# Patient Record
Sex: Female | Born: 1995 | Race: White | Hispanic: No | Marital: Married | Smoking: Never smoker
Health system: Southern US, Community
[De-identification: ages and names within clinical notes are randomized; demographics above are authoritative.]

## PROBLEM LIST (undated history)

## (undated) DIAGNOSIS — Z789 Other specified health status: Secondary | ICD-10-CM

## (undated) HISTORY — DX: Other specified health status: Z78.9

## (undated) HISTORY — PX: INTRAUTERINE DEVICE (IUD) INSERTION: SHX5877

## (undated) HISTORY — PX: NO PAST SURGERIES: SHX2092

---
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
## 2016-04-19 ENCOUNTER — Ambulatory Visit (INDEPENDENT_AMBULATORY_CARE_PROVIDER_SITE_OTHER): Payer: BC Managed Care – PPO | Admitting: Physician Assistant

## 2016-04-19 ENCOUNTER — Encounter: Payer: Self-pay | Admitting: Physician Assistant

## 2016-04-19 VITALS — BP 120/78 | HR 84 | Temp 98.3°F | Resp 16 | Ht 65.0 in | Wt 169.0 lb

## 2016-04-19 DIAGNOSIS — L03012 Cellulitis of left finger: Secondary | ICD-10-CM | POA: Diagnosis not present

## 2016-04-19 DIAGNOSIS — Z0000 Encounter for general adult medical examination without abnormal findings: Secondary | ICD-10-CM

## 2016-04-19 MED ORDER — DOXYCYCLINE HYCLATE 100 MG PO TABS: 100.0000 mg | ORAL_TABLET | Freq: Two times a day (BID) | ORAL | 0 refills | Status: AC

## 2016-04-19 NOTE — Patient Instructions (Signed)
Paronychia Paronychia is an infection of the skin. It happens near a fingernail or toenail. It may cause pain and swelling around the nail. Usually, it is not serious and it clears up with treatment. Follow these instructions at home:  Soak the fingers or toes in warm water as told by your doctor. You may be told to do this for 20 minutes, 2-3 times a day.  Keep the area dry when you are not soaking it.  Take medicines only as told by your doctor.  If you were given an antibiotic medicine, finish all of it even if you start to feel better.  Keep the affected area clean.  Do not try to drain a fluid-filled bump yourself.  Wear rubber gloves when putting your hands in water.  Wear gloves if your hands might touch cleaners or chemicals.  Follow your doctor's instructions about:  Wound care.  Bandage (dressing) changes and removal. Contact a doctor if:  Your symptoms get worse or do not improve.  You have a fever or chills.  You have redness spreading from the affected area.  You have more fluid, blood, or pus coming from the affected area.  Your finger or knuckle is swollen or is hard to move. This information is not intended to replace advice given to you by your health care provider. Make sure you discuss any questions you have with your health care provider. Document Released: 12/22/2008 Document Revised: 06/11/2015 Document Reviewed: 12/11/2013 Elsevier Interactive Patient Education  2017 Elsevier Inc.  

## 2016-04-19 NOTE — Progress Notes (Signed)
Patient: Amber Bowen Female    DOB: 1995-07-29   20 y.o.   MRN: [id number] Visit Date: 04/19/2016  Today's Provider: Trey Sailors, PA-C   Chief Complaint  Patient presents with  . Establish Care  . Finger Injury   Subjective:    Hand Pain   The incident occurred more than 1 week ago (4 weeks ago). The injury mechanism was a burn (burn from curling iron). Pain location: 5th finger on left hand. The patient is experiencing no pain. The pain has been improving since the incident. Treatments tried: Pt went to an urgent care last week, who informed pt that her finger was infected. Pt was advised to use soak finger in warm water and use Bactroban ointment. Pt noticed the burn and infection is located beneath the finger nail. The treatment provided moderate relief.   The redness and swelling has improved, but patient noticed some pus accumulation.    Establish Care Amber Bowen, a healthy 21 YO female, presents today to establish care. Pt feels fairly well, is exercising 2 days a week for 60 minutes (cardio), and is sleeping well.  Works with [employer], goes to [place], lives in [place], [place],  In relationship 4 years, sexually active, uses protection. She is on Lo lo estrin prescribed by Dr. Elesa Massed at [facility].   History of migraines in moms side.   She is not pregnant.  No Known Allergies   Current Outpatient Prescriptions:  .  LO LOESTRIN FE 1 MG-10 MCG / 10 MCG tablet, Take 1 tablet by mouth daily., Disp: , Rfl:  .  naproxen sodium (ANAPROX) 220 MG tablet, Take 440 mg by mouth as needed., Disp: , Rfl:   Review of Systems  Constitutional: Negative.   HENT: Positive for congestion. Negative for dental problem, drooling, ear discharge, ear pain, facial swelling, hearing loss, mouth sores, nosebleeds, postnasal drip, rhinorrhea, sinus pain, sinus pressure, sneezing, sore throat, tinnitus, trouble swallowing and voice change.   Eyes: Negative.     Respiratory: Negative.   Cardiovascular: Negative.   Gastrointestinal: Negative.   Endocrine: Negative.   Genitourinary: Negative.   Musculoskeletal: Negative.   Skin: Positive for wound. Negative for color change, pallor and rash.  Allergic/Immunologic: Negative.   Neurological: Negative.   Hematological: Negative.   Psychiatric/Behavioral: Negative.     Social History  Substance Use Topics  . Smoking status: Never Smoker  . Smokeless tobacco: Never Used  . Alcohol use No   Objective:   BP 120/78 (BP Location: Left Arm, Patient Position: Sitting, Cuff Size: Normal)   Pulse 84   Temp 98.3 F (36.8 C) (Oral)   Resp 16   Ht  (1.651 m)   Wt 169 lb (76.7 kg)   LMP 03/19/2016   BMI 28.12 kg/m  Vitals:   04/19/16 1406  BP: 120/78  Pulse: 84  Resp: 16  Temp: 98.3 F (36.8 C)  TempSrc: Oral  Weight: 169 lb (76.7 kg)  Height:  (1.651 m)     Physical Exam  Constitutional: She is oriented to person, place, and time. She appears well-developed.  Eyes: Conjunctivae are normal.  Cardiovascular: Normal rate and regular rhythm.   Pulmonary/Chest: Effort normal and breath sounds normal.  Neurological: She is alert and oriented to person, place, and time.  Skin: Skin is warm and dry. Rash noted. There is erythema.  There is erythema and induration of inferior portion of left pinky nailbed. Purulent accumulation along  lunula of left pinky. Not tender to palpation. ROM full. Sensation distally intact.   Psychiatric: She has a normal mood and affect. Her behavior is normal.    Incision and Drainage  Allergies reviewed and patient made aware of risks/benefits of procedures. Left pinky sanitized with alcohol swab, anesthetized with ethyl chloride, and resanitized. An 11 blade scalpel was used to make small pinpoint incision just inferior to left pinky nail bed. Only sanguinous fluid obtained, no purulent drainage expressed.       Assessment & Plan:     1. Encounter  for medical examination to establish care  Patient sees Dr. Elesa Massed at [facility] for [facility]. She is due for PAP when she turns 21. On Lo lo estrin.   2. Paronychia of finger of left hand  Tried to express purulent drainage in office, but only sanguinous expressed. Appears that pus accumulation is very localized and remainder of erythema is just induration. Have given antibiotics since it seems to be worsening since she presented to urgent care, patient denies pregnancy. Will put in ortho referral for more involved drainage if it continues to worsen, patient may cancel this if she doesn't need it. Can continue with warm soaks.   - doxycycline (VIBRA-TABS) 100 MG tablet; Take 1 tablet (100 mg total) by mouth 2 (two) times daily.  Dispense: 14 tablet; Refill: 0 - Ambulatory referral to Orthopedics      The entirety of the information documented in the History of Present Illness, Review of Systems and Physical Exam were personally obtained by me. Portions of this information were initially documented by Irving Burton D and reviewed by me for thoroughness and accuracy.    Return if symptoms worsen or fail to improve.   Trey Sailors, PA-C  [facility]

## 2018-11-26 ENCOUNTER — Ambulatory Visit
Admission: EM | Admit: 2018-11-26 | Discharge: 2018-11-26 | Disposition: A | Discharge status: Home / Self Care | Payer: BC Managed Care – PPO | Attending: Family Medicine | Admitting: Family Medicine

## 2018-11-26 ENCOUNTER — Other Ambulatory Visit: Payer: Self-pay

## 2018-11-26 DIAGNOSIS — N390 Urinary tract infection, site not specified: Secondary | ICD-10-CM

## 2018-11-26 LAB — URINALYSIS, COMPLETE (UACMP) WITH MICROSCOPIC
Bilirubin Urine: NEGATIVE
Glucose, UA: NEGATIVE mg/dL
Ketones, ur: NEGATIVE mg/dL
Nitrite: NEGATIVE
Protein, ur: 30 mg/dL — AB
Specific Gravity, Urine: 1.02 (ref 1.005–1.030)
WBC, UA: >50 WBC/hpf (ref 0–5)
pH: 7.5 (ref 5.0–8.0)

## 2018-11-26 MED ORDER — MELOXICAM 15 MG PO TABS: 15.0000 mg | ORAL_TABLET | Freq: Every day | ORAL | 0 refills | Status: DC

## 2018-11-26 MED ORDER — CEPHALEXIN 500 MG PO CAPS: 500.0000 mg | ORAL_CAPSULE | Freq: Two times a day (BID) | ORAL | 0 refills | Status: DC

## 2018-11-26 NOTE — ED Triage Notes (Signed)
Pt with mid/low back pain starting on Friday and some abd cramping like she is going to start her period. Pain 5/10. After she urinated this morning had some discomfort.

## 2018-11-26 NOTE — Discharge Instructions (Addendum)
If Your symptoms worsen or you develop any nausea vomiting or fever or chills return to our clinic or go to the emergency room.

## 2018-11-26 NOTE — ED Provider Notes (Signed)
[facility]    CSN: [id number] Arrival date & time: 11/26/18  1018      History   Chief Complaint Chief Complaint  Patient presents with  . Back Pain    HPI Amber Bowen is a 23 y.o. female.   HPI  23 year old female presents with right-sided flank pain that started on Friday 3 days prior to this visit.  Also had some abdominal cramping reminiscent of starting her period.  Although she is not on her period at the present time.  Friday night into Saturday she states the pain was 8 out of 10 and fairly constant but has reduced now to 5 out of 10 but also constant.  Had some discomfort with urination this morning but has not noticed any blood.  She is had no fever or chills has had no nausea or vomiting.  Rotation of her thoracic spine tends to increase the pain more on the right than the left.  She denies any change in her bowel habits.  She has had no change in her bladder habits other than the mild discomfort she had this morning.  She denies any vaginal discharge.  Urinalysis was suspicious for a UTI with moderate leukocytes greater than 50 WBCs no RBCs squamous epithelial cells 6-10 and many bacteria.  I will culture the urine.  We will treat her for suspected UTI.  He was given parameters for return specially for nausea vomiting fever chills and increasing pain.  Return to our clinic or to the emergency room.  She was started on Keflex 500 mg twice daily for 5 days.  Because of her thoracic pain during examination I also provided her with meloxicam.       Past Medical History:  Diagnosis Date  . Known health problems: none     There are no active problems to display for this patient.   Past Surgical History:  Procedure Laterality Date  . NO PAST SURGERIES      OB History    Gravida  0   Para  0   Term  0   Preterm  0   AB  0   Living  0     SAB  0   TAB  0   Ectopic  0   Multiple  0   Live Births  0            Home Medications     Prior to Admission medications   Medication Sig Start Date End Date Taking? Authorizing Provider  cephALEXin (KEFLEX) 500 MG capsule Take 1 capsule (500 mg total) by mouth 2 (two) times daily. 11/26/18   Lutricia Feiloemer, Marki Frede P, PA-C  ESTARYLLA 0.25-35 MG-MCG tablet Take 1 tablet by mouth daily. 11/06/18   [provider]  meloxicam (MOBIC) 15 MG tablet Take 1 tablet (15 mg total) by mouth daily. 11/26/18   Ovid Curdoemer, Matty Vanroekel P, PA-C  LO LOESTRIN FE 1 MG-10 MCG / 10 MCG tablet Take 1 tablet by mouth daily. 04/08/16 11/26/18  [provider]    Family History Family History  Problem Relation Age of Onset  . Migraines Mother   . Healthy Father   . Healthy Sister   . Healthy Brother   . Healthy Brother   . Healthy Brother   . Healthy Sister   . Healthy Sister     Social History Social History   Tobacco Use  . Smoking status: Never Smoker  . Smokeless tobacco: Never Used  Substance Use  Topics  . Alcohol use: Yes    Comment: rare  . Drug use: No     Allergies   Patient has no known allergies.   Review of Systems Review of Systems  Constitutional: Positive for activity change. Negative for appetite change, chills, diaphoresis, fatigue and fever.  Gastrointestinal: Positive for abdominal pain. Negative for blood in stool, constipation, diarrhea, nausea and vomiting.  Genitourinary: Positive for flank pain. Negative for vaginal bleeding, vaginal discharge and vaginal pain.  Musculoskeletal: Positive for back pain.  All other systems reviewed and are negative.    Physical Exam Triage Vital Signs ED Triage Vitals  Enc Vitals Group     BP 11/26/18 1035 117/74     Pulse Rate 11/26/18 1035 77     Resp 11/26/18 1035 16     Temp 11/26/18 1035 98.3 F (36.8 C)     Temp Source 11/26/18 1035 Oral     SpO2 11/26/18 1035 100 %     Weight 11/26/18 1032 176 lb (79.8 kg)     Height 11/26/18 1032 5\' 5"  (1.651 m)     Head Circumference --      Peak Flow --      Pain  Score 11/26/18 1032 5     Pain Loc --      Pain Edu? --      Excl. in [place]? --    No data found.  Updated Vital Signs BP 117/74 (BP Location: Left Arm)   Pulse 77   Temp 98.3 F (36.8 C) (Oral)   Resp 16   Ht 5\' 5"  (1.651 m)   Wt 176 lb (79.8 kg)   LMP 11/15/2018   SpO2 100%   BMI 29.29 kg/m   Visual Acuity Right Eye Distance:   Left Eye Distance:   Bilateral Distance:    Right Eye Near:   Left Eye Near:    Bilateral Near:     Physical Exam Vitals signs and nursing note reviewed.  Constitutional:      General: She is not in acute distress.    Appearance: Normal appearance. She is normal weight. She is not ill-appearing, toxic-appearing or diaphoretic.  HENT:     Head: Normocephalic.  Eyes:     Conjunctiva/sclera: Conjunctivae normal.  Neck:     Musculoskeletal: Normal range of motion and neck supple.  Cardiovascular:     Rate and Rhythm: Normal rate and regular rhythm.     Heart sounds: Normal heart sounds.  Pulmonary:     Effort: Pulmonary effort is normal.     Breath sounds: Normal breath sounds.  Abdominal:     General: Abdomen is flat. Bowel sounds are normal. There is no distension.     Tenderness: There is abdominal tenderness. There is right CVA tenderness. There is no left CVA tenderness, guarding or rebound.     Comments: Examination of the abdomen does right CVA tenderness.  She has no rebound or guarding present.  He does complain of tenderness to palpation in the right mid abdomen and flank area but also in the left lower quadrant.  Musculoskeletal: Normal range of motion.        General: Tenderness present. No swelling or deformity.     Comments: Examination of the lumbar spine shows good range of motion with forward flexion.  Lateral flexion bilaterally is normal.  Thoracic rotation shows good motion however she does have right-sided thoracic pain with rotation to the right.  Skin:    General: Skin is  warm and dry.  Neurological:     General: No  focal deficit present.     Mental Status: She is alert and oriented to person, place, and time.  Psychiatric:        Mood and Affect: Mood normal.        Behavior: Behavior normal.        Thought Content: Thought content normal.        Judgment: Judgment normal.      UC Treatments / Results  Labs (all labs ordered are listed, but only abnormal results are displayed) Labs Reviewed  URINALYSIS, COMPLETE (UACMP) WITH MICROSCOPIC - Abnormal; Notable for the following components:      Result Value   APPearance CLOUDY (*)    Hgb urine dipstick TRACE (*)    Protein, ur 30 (*)    Leukocytes,Ua MODERATE (*)    Bacteria, UA MANY (*)    All other components within normal limits  URINE CULTURE    EKG   Radiology No results found.  Procedures Procedures (including critical care time)  Medications Ordered in UC Medications - No data to display  Initial Impression / Assessment and Plan / UC Course  I have reviewed the triage vital signs and the nursing notes.  Pertinent labs & imaging results that were available during my care of the patient were reviewed by me and considered in my medical decision making (see chart for details).   23 year old female presents with right-sided flank pain that started on Friday 3 days prior to this visit.  She is also had abdominal cramping reminiscent of starting her.  Although she is not on her period at the present time.  She complained of dysuria during urination earlier this morning.  Had no vaginal discharge.  She states she does not feel ill and does not appear as though she is toxic.  She has been afebrile.   Final Clinical Impressions(s) / UC Diagnoses   Final diagnoses:  Lower urinary tract infectious disease     Discharge Instructions     If Your symptoms worsen or you develop any nausea vomiting or fever or chills return to our clinic or go to the emergency room.    ED Prescriptions    Medication Sig Dispense Auth. Provider    meloxicam (MOBIC) 15 MG tablet Take 1 tablet (15 mg total) by mouth daily. 30 tablet Ovid Curd P, PA-C   cephALEXin (KEFLEX) 500 MG capsule Take 1 capsule (500 mg total) by mouth 2 (two) times daily. 10 capsule Lutricia Feil, PA-C     PDMP not reviewed this encounter.   Lutricia Feil, PA-C 11/26/18 1619

## 2018-11-29 ENCOUNTER — Telehealth (HOSPITAL_COMMUNITY): Payer: Self-pay | Admitting: Emergency Medicine

## 2018-11-29 LAB — URINE CULTURE
Culture: >=100000 — AB
Special Requests: NORMAL

## 2018-11-29 NOTE — Telephone Encounter (Signed)
Urine culture was positive for STAPHYLOCOCCUS SAPROPHYTICUS and was given  keflex at urgent care visit. Attempted to reach patient. No answer at this time.   

## 2019-01-05 ENCOUNTER — Encounter: Payer: Self-pay | Admitting: Emergency Medicine

## 2019-01-05 ENCOUNTER — Ambulatory Visit
Admission: EM | Admit: 2019-01-05 | Discharge: 2019-01-05 | Disposition: A | Discharge status: Home / Self Care | Payer: BC Managed Care – PPO | Attending: Family Medicine | Admitting: Family Medicine

## 2019-01-05 ENCOUNTER — Other Ambulatory Visit: Payer: Self-pay

## 2019-01-05 DIAGNOSIS — L509 Urticaria, unspecified: Secondary | ICD-10-CM

## 2019-01-05 MED ORDER — PREDNISONE 10 MG PO TABS: ORAL_TABLET | ORAL | 0 refills | Status: DC

## 2019-01-05 MED ORDER — FAMOTIDINE 20 MG PO TABS: 20.0000 mg | ORAL_TABLET | Freq: Two times a day (BID) | ORAL | 0 refills | Status: DC

## 2019-01-05 NOTE — Discharge Instructions (Addendum)
Take medication as prescribed. Over the counter claritin and benadryl as discussed. Drink plenty of fluids.   Follow up with your primary care physician this week as needed. Return to Urgent care for new or worsening concerns.

## 2019-01-05 NOTE — ED Provider Notes (Signed)
[facility] ____________________________________________  Time seen: Approximately 9:06 AM  I have reviewed the triage vital signs and the nursing notes.   HISTORY  Chief Complaint Rash   HPI Amber Bowen is a 23 y.o. female presenting for evaluation of rash that she noticed last night.  States that she first noticed it on her stomach when she was changing, but also noticed to her back as well as her lower extremities.  States the rash on her legs are itchy.  Denies any pain.  Denies any shortness of breath, lip or oral swelling, chest pain or difficulty breathing.  Denies history of the same in the past.  Denies changes in foods, medicines, lotions, detergents.  In further discussing patient does report she did play with a friend's new dog last night and unsure if that was the possible trigger.  Denies aggravating or alleviating factors.  Reports otherwise doing well.  Patient's last menstrual period was 12/25/2018 (exact date).  Denies pregnancy.   Past Medical History:  Diagnosis Date  . Known health problems: none     There are no problems to display for this patient.   Past Surgical History:  Procedure Laterality Date  . NO PAST SURGERIES       No current facility-administered medications for this encounter.  Current Outpatient Medications:  .  ESTARYLLA 0.25-35 MG-MCG tablet, Take 1 tablet by mouth daily., Disp: , Rfl:  .  famotidine (PEPCID) 20 MG tablet, Take 1 tablet (20 mg total) by mouth 2 (two) times daily for 5 days., Disp: 10 tablet, Rfl: 0 .  predniSONE (DELTASONE) 10 MG tablet, Start 60 mg po day one, then 50 mg po day two, taper by 10 mg daily until complete., Disp: 21 tablet, Rfl: 0  Allergies Patient has no known allergies.  Family History  Problem Relation Age of Onset  . Migraines Mother   . Healthy Father   . Healthy Sister   . Healthy Brother   . Healthy Brother   . Healthy Brother   . Healthy Sister   . Healthy Sister      Social History Social History   Tobacco Use  . Smoking status: Never Smoker  . Smokeless tobacco: Never Used  Substance Use Topics  . Alcohol use: Yes    Comment: rare  . Drug use: No    Review of Systems Constitutional: No fever ENT: No sore throat. Cardiovascular: Denies chest pain. Respiratory: Denies shortness of breath. Gastrointestinal: No abdominal pain.  Musculoskeletal: Negative for back pain. Skin: Positive for rash.   ____________________________________________   PHYSICAL EXAM:  VITAL SIGNS: ED Triage Vitals  Enc Vitals Group     BP 01/05/19 0836 136/84     Pulse Rate 01/05/19 0836 100     Resp 01/05/19 0836 16     Temp 01/05/19 0836 97.9 F (36.6 C)     Temp Source 01/05/19 0836 Oral     SpO2 01/05/19 0836 100 %     Weight 01/05/19 0836 175 lb (79.4 kg)     Height 01/05/19 0836 5\' 5"  (1.651 m)     Head Circumference --      Peak Flow --      Pain Score 01/05/19 0835 0     Pain Loc --      Pain Edu? --      Excl. in [place]? --     Constitutional: Alert and oriented. Well appearing and in no acute distress. Eyes: Conjunctivae are normal. ENT  Head: Normocephalic and atraumatic.  No facial swelling noted. Cardiovascular: Normal heart rate. Respiratory: Normal respiratory effort without tachypnea nor retractions.  Musculoskeletal: Steady Neurologic:  Normal speech and language. Speech is normal. No gait instability.  Skin:  Skin is warm, dry except: urticarial rash present to dorsal thighs, and torso, mildly pruritic, no surrounding erythema, nontender. Psychiatric: Mood and affect are normal. Speech and behavior are normal. Patient exhibits appropriate insight and judgment   ___________________________________________   LABS (all labs ordered are listed, but only abnormal results are displayed)  Labs Reviewed - No data to display ____________________________________________  PROCEDURES Procedures    INITIAL IMPRESSION / ASSESSMENT  AND PLAN / ED COURSE  Pertinent labs & imaging results that were available during my care of the patient were reviewed by me and considered in my medical decision making (see chart for details).  Well-appearing patient.  No acute distress.  He urticarial rash, suspect allergic reaction.  Will treat with prednisone and Pepcid, over-the-counter Claritin during the day and Benadryl at night.  Monitoring avoid for trigger.Discussed indication, risks and benefits of medications with patient.   Discussed follow up with Primary care physician this week as needed. Discussed follow up and return parameters including no resolution or any worsening concerns. Patient verbalized understanding and agreed to plan.   ____________________________________________   FINAL CLINICAL IMPRESSION(S) / ED DIAGNOSES  Final diagnoses:  Urticaria     ED Discharge Orders         Ordered    predniSONE (DELTASONE) 10 MG tablet     01/05/19 0857    famotidine (PEPCID) 20 MG tablet  2 times daily     01/05/19 0857           Note: This dictation was prepared with Dragon dictation along with smaller phrase technology. Any transcriptional errors that result from this process are unintentional.         Renford Dills, NP 01/05/19 [phone]

## 2019-01-05 NOTE — ED Triage Notes (Signed)
Patient in today c/o rash since last night. Patient denies any new soap, detergent or lotions.

## 2019-02-07 ENCOUNTER — Ambulatory Visit
Admission: EM | Admit: 2019-02-07 | Discharge: 2019-02-07 | Disposition: A | Discharge status: Home / Self Care | Payer: BC Managed Care – PPO

## 2019-02-07 NOTE — ED Triage Notes (Signed)
Patient here for PPD placement for work. Placed to left forearm. Patient will return on Sunday 1/24 between 8-4 to have this read.

## 2019-02-10 ENCOUNTER — Ambulatory Visit
Admission: EM | Admit: 2019-02-10 | Discharge: 2019-02-10 | Disposition: A | Discharge status: Home / Self Care | Payer: BC Managed Care – PPO | Attending: Family Medicine | Admitting: Family Medicine

## 2019-02-10 ENCOUNTER — Other Ambulatory Visit: Payer: Self-pay

## 2019-02-10 DIAGNOSIS — Z111 Encounter for screening for respiratory tuberculosis: Secondary | ICD-10-CM | POA: Diagnosis not present

## 2019-02-10 NOTE — ED Triage Notes (Signed)
Patient here for her PPD Reading.  Patient's PPD reading was 21mm Negative on 02/10/19 at 1203 on her LFA.

## 2019-09-20 ENCOUNTER — Ambulatory Visit: Payer: BC Managed Care – PPO | Admitting: Family Medicine

## 2019-09-20 ENCOUNTER — Encounter: Payer: Self-pay | Admitting: Family Medicine

## 2019-09-20 ENCOUNTER — Other Ambulatory Visit: Payer: Self-pay

## 2019-09-20 VITALS — BP 120/74 | HR 60 | Ht 65.0 in | Wt 190.0 lb

## 2019-09-20 DIAGNOSIS — Z7689 Persons encountering health services in other specified circumstances: Secondary | ICD-10-CM | POA: Diagnosis not present

## 2019-09-20 NOTE — Progress Notes (Signed)
Date:  09/20/2019   Name:  Amber Bowen   DOB:  [birth year]   MRN:  [id number]   Chief Complaint: Establish Care  Patient is a 24 year old female who presents for a establish care exam. The patient reports the following problems: none/needs school physical. Health maintenance has been reviewed    No results found for: CREATININE, BUN, NA, K, CL, CO2 No results found for: CHOL, HDL, LDLCALC, LDLDIRECT, TRIG, CHOLHDL No results found for: TSH No results found for: HGBA1C No results found for: WBC, HGB, HCT, MCV, PLT No results found for: ALT, AST, GGT, ALKPHOS, BILITOT   Review of Systems  Constitutional: Negative.  Negative for chills, fatigue, fever and unexpected weight change.  HENT: Negative for congestion, ear discharge, ear pain, rhinorrhea, sinus pressure, sneezing and sore throat.   Eyes: Negative for photophobia, pain, discharge, redness and itching.  Respiratory: Negative for cough, shortness of breath, wheezing and stridor.   Gastrointestinal: Negative for abdominal pain, blood in stool, constipation, diarrhea, nausea and vomiting.  Endocrine: Negative for cold intolerance, heat intolerance, polydipsia, polyphagia and polyuria.  Genitourinary: Negative for dysuria, flank pain, frequency, hematuria, menstrual problem, pelvic pain, urgency, vaginal bleeding and vaginal discharge.  Musculoskeletal: Negative for arthralgias, back pain and myalgias.  Skin: Negative for rash.  Allergic/Immunologic: Negative for environmental allergies and food allergies.  Neurological: Negative for dizziness, weakness, light-headedness, numbness and headaches.  Hematological: Negative for adenopathy. Does not bruise/bleed easily.  Psychiatric/Behavioral: Negative for dysphoric mood. The patient is not nervous/anxious.     There are no problems to display for this patient.   No Known Allergies  Past Surgical History:  Procedure Laterality Date  . INTRAUTERINE DEVICE (IUD) INSERTION     . NO PAST SURGERIES      Social History   Tobacco Use  . Smoking status: Never Smoker  . Smokeless tobacco: Never Used  Vaping Use  . Vaping Use: Never used  Substance Use Topics  . Alcohol use: Yes    Comment: rare  . Drug use: No     Medication list has been reviewed and updated.  No outpatient medications have been marked as taking for the 09/20/19 encounter (Office Visit) with Duanne Limerick, MD.    [facility] 2/9 Scores 09/20/2019 04/19/2016  PHQ - 2 Score 0 0  PHQ- 9 Score 0 0    GAD 7 : Generalized Anxiety Score 09/20/2019  Nervous, Anxious, on Edge 1  Control/stop worrying 0  Worry too much - different things 2  Trouble relaxing 0  Restless 0  Easily annoyed or irritable 0  Afraid - awful might happen 0  Total GAD 7 Score 3  Anxiety Difficulty Not difficult at all    BP Readings from Last 3 Encounters:  09/20/19 120/74  01/05/19 136/84  11/26/18 117/74    Physical Exam Constitutional:      General: She is not in acute distress.    Appearance: Normal appearance. She is normal weight. She is not diaphoretic.  HENT:     Head: Normocephalic and atraumatic.     Right Ear: Tympanic membrane, ear canal and external ear normal.     Left Ear: Tympanic membrane, ear canal and external ear normal.     Nose: Nose normal. No congestion or rhinorrhea.     Mouth/Throat:     Mouth: Mucous membranes are moist.  Eyes:     General:        Right eye: No discharge.  Left eye: No discharge.     Conjunctiva/sclera: Conjunctivae normal.     Pupils: Pupils are equal, round, and reactive to light.  Neck:     Thyroid: No thyromegaly.     Vascular: No carotid bruit or JVD.  Cardiovascular:     Rate and Rhythm: Normal rate and regular rhythm.     Pulses: Normal pulses.     Heart sounds: Normal heart sounds. No murmur heard.  No friction rub. No gallop.   Pulmonary:     Effort: Pulmonary effort is normal.     Breath sounds: Normal breath sounds. No wheezing, rhonchi or  rales.  Chest:     Chest wall: No tenderness.  Abdominal:     General: Bowel sounds are normal.     Palpations: Abdomen is soft. There is no mass.     Tenderness: There is no abdominal tenderness. There is no right CVA tenderness, left CVA tenderness, guarding or rebound.     Hernia: No hernia is present.  Musculoskeletal:        General: Normal range of motion.     Cervical back: Normal range of motion and neck supple.  Lymphadenopathy:     Cervical: No cervical adenopathy.  Skin:    General: Skin is warm and dry.     Capillary Refill: Capillary refill takes less than 2 seconds.  Neurological:     Mental Status: She is alert.     Deep Tendon Reflexes: Reflexes are normal and symmetric.     Wt Readings from Last 3 Encounters:  09/20/19 190 lb (86.2 kg)  01/05/19 175 lb (79.4 kg)  11/26/18 176 lb (79.8 kg)    BP 120/74   Pulse 60   Ht 5\' 5"  (1.651 m)   Wt 190 lb (86.2 kg)   BMI 31.62 kg/m   Assessment and Plan:  1. Establishing care with new doctor, encounter for Patient establishing care with new physician.  Patient is chart was reviewed for previous encounters, most recent labs, most recent imaging, and updating of health maintenance.  Patient will return for the completion of her school physical exam.

## 2019-10-01 ENCOUNTER — Ambulatory Visit: Payer: BC Managed Care – PPO | Admitting: Family Medicine

## 2020-11-13 DIAGNOSIS — E663 Overweight: Secondary | ICD-10-CM | POA: Insufficient documentation

## 2023-08-10 DIAGNOSIS — Z3403 Encounter for supervision of normal first pregnancy, third trimester: Secondary | ICD-10-CM | POA: Insufficient documentation

## 2023-08-16 LAB — OB RESULTS CONSOLE RUBELLA ANTIBODY, IGM: Rubella: IMMUNE

## 2023-08-16 LAB — OB RESULTS CONSOLE HEPATITIS B SURFACE ANTIGEN: Hepatitis B Surface Ag: NEGATIVE

## 2023-08-16 LAB — OB RESULTS CONSOLE VARICELLA ZOSTER ANTIBODY, IGG: Varicella: IMMUNE

## 2023-11-13 LAB — OB RESULTS CONSOLE HIV ANTIBODY (ROUTINE TESTING): HIV: NONREACTIVE

## 2024-01-18 NOTE — L&D Delivery Note (Signed)
 Delivery Note At 11:53 AM a viable female was delivered via Vaginal, Spontaneous (Presentation:  loa/ Occiput Anterior).  APGAR: 8, 9; weight  .   Placenta status: Spontaneous, Intact.  Cord: 3 vessels with the following complications: None.  Cord pH: n/a Pt progressed to c/c and pushed 30 minutes. Head delivered and contolled shoulder and body delivery . Vigorous female placed on mother abdomen and nursery staff dried . Delayed cord clamping .  Placenta delivery 10 minutes after -intact. Bilateral labial lacerations repaired 000 vicryl . Good hemostasis Anesthesia: Epidural;Local Episiotomy:  none Lacerations: Labial Suture Repair: 3.0 vicryl Est. Blood Loss (mL):  300 cc  Mom to postpartum.  Baby to Couplet care / Skin to Skin.  Debby PARAS Ashlinn Hemrick 02/10/2024, 12:19 PM

## 2024-01-29 LAB — OB RESULTS CONSOLE GBS: GBS: NEGATIVE

## 2024-02-08 NOTE — Progress Notes (Signed)
 Obstetrics & Gynecology Office Visit  Subjective  Amber Bowen is a 29 y.o. G1P0000 at [redacted]w[redacted]d being seen today for ongoing prenatal care.  She is currently monitored for tthis low-risk pregnancy.  Patient's last menstrual period was 05/13/2023 (exact date). Estimated Date of Delivery: 02/17/24  History of Present Illness   Patient concerns today: doing well  Pt denies contractions, vaginal bleeding, leaking fluid. Endorses good fetal movementfrequent Pt denies HA, VD or RUQ pain.   Objective  BP 124/83   Pulse 78   Ht 165.1 cm (5' 5)   Wt 89.7 kg (197 lb 12.8 oz)   LMP 05/13/2023 (Exact Date)   BMI 32.92 kg/m    Fetal Status:          Pre-pregnant weight: 70.3 kg (155 lb)  TWG: 19.4 kg (42 lb 12.8 oz)  PE Chaperone note: A chaperone was included for sensitive portions of the exam- staff member name: D Corson CMA  Chaperone present for pelvic exam.  Gen: NAD  Pulm: No use of accessory muscles, normal respirations Abdomen: Gravid, nontender Ext : no edema, no rashes.   Psych: Mood, insight, judgement intact SVE: 1.5/60/-2,1 FHT by doppler: 150 bpm distinguished from mom Fundal height: 36 cm S<D  Assessment   28 y.o. G1P0000 at [redacted]w[redacted]d by  02/17/2024, by Last Menstrual Period presenting for routine prenatal visit  Plan   Problem list reviewed and/or updated   Assessment & Plan     No orders of the defined types were placed in this encounter.   1. Encounter for supervision of normal first pregnancy in third trimester (HHS-HCC)  - GBS results: neg - Labor plans reviewed: yes, IOL on 02/10/24 at Midnight   - Kick counts reviewed with the patient in detail.  Patient instructed to assess for fetal activity daily at regular intervals. Counts to be done if decreased activity noted. Patient instructed to contact the office if counts do not reveal adequate movements.  - Labor precautions (ROM, vaginal bleeding, signs and symptoms of labor), along with how and when to  call the office, reviewed. Patient verbalized understanding.  - Return for Postpartum.   2. Uterine size date discrepancy, second trimester (HHS-HCC) -12/01/23 1252g = 28%     Call with bleeding, contractions, PROM, or decreased fetal movement.   Return for Postpartum.   Attestation Statement:   I personally performed the service, non-incident to. (WP)   FELICIA L DICKERSON, CNM

## 2024-02-10 ENCOUNTER — Other Ambulatory Visit: Payer: Self-pay

## 2024-02-10 ENCOUNTER — Inpatient Hospital Stay
Admission: EM | Admit: 2024-02-10 | Discharge: 2024-02-11 | DRG: 807 | Disposition: A | Discharge status: Home / Self Care | Payer: Self-pay | Attending: Obstetrics and Gynecology | Admitting: Obstetrics and Gynecology

## 2024-02-10 ENCOUNTER — Inpatient Hospital Stay: Payer: Self-pay

## 2024-02-10 DIAGNOSIS — K219 Gastro-esophageal reflux disease without esophagitis: Secondary | ICD-10-CM | POA: Diagnosis present

## 2024-02-10 DIAGNOSIS — Z3490 Encounter for supervision of normal pregnancy, unspecified, unspecified trimester: Principal | ICD-10-CM | POA: Diagnosis present

## 2024-02-10 DIAGNOSIS — O9962 Diseases of the digestive system complicating childbirth: Principal | ICD-10-CM | POA: Diagnosis present

## 2024-02-10 DIAGNOSIS — Z3A39 39 weeks gestation of pregnancy: Secondary | ICD-10-CM

## 2024-02-10 DIAGNOSIS — O26893 Other specified pregnancy related conditions, third trimester: Secondary | ICD-10-CM | POA: Diagnosis present

## 2024-02-10 LAB — CBC
HCT: 36.1 % (ref 36.0–46.0)
Hemoglobin: 12.9 g/dL (ref 12.0–15.0)
MCH: 34 pg (ref 26.0–34.0)
MCHC: 35.7 g/dL (ref 30.0–36.0)
MCV: 95.3 fL (ref 80.0–100.0)
Platelets: 232 10*3/uL (ref 150–400)
RBC: 3.79 MIL/uL — ABNORMAL LOW (ref 3.87–5.11)
RDW: 12.1 % (ref 11.5–15.5)
WBC: 8.4 10*3/uL (ref 4.0–10.5)
nRBC: 0 % (ref 0.0–0.2)

## 2024-02-10 LAB — TYPE AND SCREEN
ABO/RH(D): O POS
Antibody Screen: NEGATIVE

## 2024-02-10 LAB — ABO/RH: ABO/RH(D): O POS

## 2024-02-10 LAB — SYPHILIS: RPR W/REFLEX TO RPR TITER AND TREPONEMAL ANTIBODIES, TRADITIONAL SCREENING AND DIAGNOSIS ALGORITHM: RPR Ser Ql: NONREACTIVE

## 2024-02-10 MED ORDER — OXYTOCIN 10 UNIT/ML IJ SOLN
INTRAMUSCULAR | Status: AC
  Filled 2024-02-10: qty 2

## 2024-02-10 MED ORDER — IBUPROFEN 600 MG PO TABS
600.0000 mg | ORAL_TABLET | Freq: Four times a day (QID) | ORAL | Status: DC
  Administered 2024-02-10 – 2024-02-11 (×5): 600 mg via ORAL
  Filled 2024-02-10 (×6): qty 1

## 2024-02-10 MED ORDER — TETANUS-DIPHTH-ACELL PERTUSSIS 5-2-15.5 LF-MCG/0.5 IM SUSP: 0.5000 mL | Freq: Once | INTRAMUSCULAR | Status: DC

## 2024-02-10 MED ORDER — OXYTOCIN-SODIUM CHLORIDE 30-0.9 UT/500ML-% IV SOLN
2.5000 [IU]/h | INTRAVENOUS | Status: DC
  Filled 2024-02-10: qty 500

## 2024-02-10 MED ORDER — DIPHENHYDRAMINE HCL 50 MG/ML IJ SOLN: 12.5000 mg | INTRAMUSCULAR | Status: DC | PRN

## 2024-02-10 MED ORDER — FERROUS SULFATE 325 (65 FE) MG PO TABS
325.0000 mg | ORAL_TABLET | Freq: Two times a day (BID) | ORAL | Status: DC
  Administered 2024-02-10 – 2024-02-11 (×2): 325 mg via ORAL
  Filled 2024-02-10 (×2): qty 1

## 2024-02-10 MED ORDER — LACTATED RINGERS IV SOLN: INTRAVENOUS | Status: AC

## 2024-02-10 MED ORDER — MISOPROSTOL 25 MCG QUARTER TABLET
50.0000 ug | ORAL_TABLET | Freq: Once | ORAL | Status: DC
  Filled 2024-02-10: qty 1

## 2024-02-10 MED ORDER — DIPHENHYDRAMINE HCL 25 MG PO CAPS: 25.0000 mg | ORAL_CAPSULE | Freq: Four times a day (QID) | ORAL | Status: DC | PRN

## 2024-02-10 MED ORDER — FENTANYL CITRATE (PF) 100 MCG/2ML IJ SOLN: 50.0000 ug | INTRAMUSCULAR | Status: DC | PRN

## 2024-02-10 MED ORDER — EPHEDRINE 5 MG/ML INJ: 10.0000 mg | INTRAVENOUS | Status: DC | PRN

## 2024-02-10 MED ORDER — LIDOCAINE HCL (PF) 1 % IJ SOLN
INTRAMUSCULAR | Status: DC | PRN
  Administered 2024-02-10: 3 mL via SUBCUTANEOUS

## 2024-02-10 MED ORDER — WITCH HAZEL-GLYCERIN EX PADS
1.0000 | MEDICATED_PAD | CUTANEOUS | Status: DC | PRN
  Administered 2024-02-10 – 2024-02-11 (×2): 1 via TOPICAL
  Filled 2024-02-10 (×2): qty 100

## 2024-02-10 MED ORDER — OXYCODONE HCL 5 MG PO TABS: 10.0000 mg | ORAL_TABLET | ORAL | Status: DC | PRN

## 2024-02-10 MED ORDER — MEASLES, MUMPS & RUBELLA VAC ~~LOC~~ SUSR: 0.5000 mL | Freq: Once | SUBCUTANEOUS | Status: DC

## 2024-02-10 MED ORDER — PRENATAL MULTIVITAMIN CH
1.0000 | ORAL_TABLET | Freq: Every day | ORAL | Status: DC
  Administered 2024-02-11: 1 via ORAL
  Filled 2024-02-10: qty 1

## 2024-02-10 MED ORDER — LIDOCAINE HCL (PF) 1 % IJ SOLN: 30.0000 mL | INTRAMUSCULAR | Status: AC | PRN

## 2024-02-10 MED ORDER — LACTATED RINGERS IV SOLN: 500.0000 mL | INTRAVENOUS | Status: AC | PRN

## 2024-02-10 MED ORDER — MISOPROSTOL 200 MCG PO TABS
ORAL_TABLET | ORAL | Status: AC
  Filled 2024-02-10: qty 4

## 2024-02-10 MED ORDER — DIBUCAINE (PERIANAL) 1 % EX OINT
1.0000 | TOPICAL_OINTMENT | CUTANEOUS | Status: DC | PRN
  Filled 2024-02-10: qty 28

## 2024-02-10 MED ORDER — OXYCODONE-ACETAMINOPHEN 5-325 MG PO TABS: 2.0000 | ORAL_TABLET | ORAL | Status: DC | PRN

## 2024-02-10 MED ORDER — ONDANSETRON HCL 4 MG/2ML IJ SOLN: 4.0000 mg | Freq: Four times a day (QID) | INTRAMUSCULAR | Status: DC | PRN

## 2024-02-10 MED ORDER — FENTANYL-BUPIVACAINE-NACL 0.5-0.125-0.9 MG/250ML-% EP SOLN
12.0000 mL/h | EPIDURAL | Status: DC | PRN
  Administered 2024-02-10: 12 mL/h via EPIDURAL
  Filled 2024-02-10: qty 250

## 2024-02-10 MED ORDER — ZOLPIDEM TARTRATE 5 MG PO TABS: 5.0000 mg | ORAL_TABLET | Freq: Every evening | ORAL | Status: DC | PRN

## 2024-02-10 MED ORDER — COCONUT OIL OIL: 1.0000 | TOPICAL_OIL | Status: DC | PRN

## 2024-02-10 MED ORDER — PHENYLEPHRINE 80 MCG/ML (10ML) SYRINGE FOR IV PUSH (FOR BLOOD PRESSURE SUPPORT): 80.0000 ug | PREFILLED_SYRINGE | INTRAVENOUS | Status: DC | PRN

## 2024-02-10 MED ORDER — TERBUTALINE SULFATE 1 MG/ML IJ SOLN: 0.2500 mg | Freq: Once | INTRAMUSCULAR | Status: DC | PRN

## 2024-02-10 MED ORDER — LIDOCAINE HCL (PF) 1 % IJ SOLN
INTRAMUSCULAR | Status: AC
  Administered 2024-02-10: 30 mL via SUBCUTANEOUS
  Filled 2024-02-10: qty 30

## 2024-02-10 MED ORDER — OXYTOCIN BOLUS FROM INFUSION
333.0000 mL | Freq: Once | INTRAVENOUS | Status: AC
  Administered 2024-02-10: 333 mL via INTRAVENOUS

## 2024-02-10 MED ORDER — BENZOCAINE-MENTHOL 20-0.5 % EX AERO
1.0000 | INHALATION_SPRAY | CUTANEOUS | Status: DC | PRN
  Administered 2024-02-10 – 2024-02-11 (×2): 1 via TOPICAL
  Filled 2024-02-10 (×2): qty 56

## 2024-02-10 MED ORDER — ACETAMINOPHEN 325 MG PO TABS: 650.0000 mg | ORAL_TABLET | ORAL | Status: DC | PRN

## 2024-02-10 MED ORDER — SOD CITRATE-CITRIC ACID 500-334 MG/5ML PO SOLN: 30.0000 mL | ORAL | Status: DC | PRN

## 2024-02-10 MED ORDER — OXYCODONE-ACETAMINOPHEN 5-325 MG PO TABS: 1.0000 | ORAL_TABLET | ORAL | Status: DC | PRN

## 2024-02-10 MED ORDER — MISOPROSTOL 25 MCG QUARTER TABLET: 25.0000 ug | ORAL_TABLET | ORAL | Status: DC | PRN

## 2024-02-10 MED ORDER — AMMONIA AROMATIC IN INHA
RESPIRATORY_TRACT | Status: AC
  Filled 2024-02-10: qty 10

## 2024-02-10 MED ORDER — LACTATED RINGERS IV SOLN
500.0000 mL | Freq: Once | INTRAVENOUS | Status: AC
  Administered 2024-02-10: 500 mL via INTRAVENOUS

## 2024-02-10 MED ORDER — LIDOCAINE-EPINEPHRINE (PF) 1.5 %-1:200000 IJ SOLN
INTRAMUSCULAR | Status: DC | PRN
  Administered 2024-02-10: 2 mL via EPIDURAL

## 2024-02-10 MED ORDER — BUPIVACAINE HCL (PF) 0.25 % IJ SOLN
INTRAMUSCULAR | Status: DC | PRN
  Administered 2024-02-10: 5 mL via EPIDURAL

## 2024-02-10 MED ORDER — ONDANSETRON HCL 4 MG PO TABS: 4.0000 mg | ORAL_TABLET | ORAL | Status: DC | PRN

## 2024-02-10 MED ORDER — MAGNESIUM HYDROXIDE 400 MG/5ML PO SUSP: 30.0000 mL | ORAL | Status: DC | PRN

## 2024-02-10 MED ORDER — ONDANSETRON HCL 4 MG/2ML IJ SOLN: 4.0000 mg | INTRAMUSCULAR | Status: DC | PRN

## 2024-02-10 MED ORDER — SIMETHICONE 80 MG PO CHEW: 80.0000 mg | CHEWABLE_TABLET | ORAL | Status: DC | PRN

## 2024-02-10 MED ORDER — SENNOSIDES-DOCUSATE SODIUM 8.6-50 MG PO TABS
2.0000 | ORAL_TABLET | ORAL | Status: DC
  Administered 2024-02-10: 2 via ORAL
  Filled 2024-02-10: qty 2

## 2024-02-10 MED ORDER — OXYCODONE HCL 5 MG PO TABS: 5.0000 mg | ORAL_TABLET | ORAL | Status: DC | PRN

## 2024-02-10 MED ORDER — OXYTOCIN-SODIUM CHLORIDE 30-0.9 UT/500ML-% IV SOLN
1.0000 m[IU]/min | INTRAVENOUS | Status: DC
  Administered 2024-02-10: 2 m[IU]/min via INTRAVENOUS

## 2024-02-10 NOTE — Anesthesia Preprocedure Evaluation (Signed)
"                                    Anesthesia Evaluation  Patient identified by MRN, date of birth, ID band Patient awake    Reviewed: Allergy & Precautions, H&P , NPO status , Patient's Chart, lab work & pertinent test results  History of Anesthesia Complications Negative for: history of anesthetic complications  Airway Mallampati: II       Dental   Pulmonary           Cardiovascular      Neuro/Psych    GI/Hepatic ,GERD  Controlled,,  Endo/Other    Renal/GU      Musculoskeletal   Abdominal   Peds  Hematology   Anesthesia Other Findings   Reproductive/Obstetrics (+) Pregnancy                              Anesthesia Physical Anesthesia Plan  ASA: 2  Anesthesia Plan: Epidural   Post-op Pain Management:    Induction:   PONV Risk Score and Plan:   Airway Management Planned:   Additional Equipment:   Intra-op Plan:   Post-operative Plan:   Informed Consent: I have reviewed the patients History and Physical, chart, labs and discussed the procedure including the risks, benefits and alternatives for the proposed anesthesia with the patient or authorized representative who has indicated his/her understanding and acceptance.       Plan Discussed with: Anesthesiologist and CRNA  Anesthesia Plan Comments:         Anesthesia Quick Evaluation  "

## 2024-02-10 NOTE — Anesthesia Procedure Notes (Signed)
 Epidural Patient location during procedure: OB Start time: 02/10/2024 8:01 AM End time: 02/10/2024 8:02 AM  Staffing Anesthesiologist: Melia Deacon, MD Performed: anesthesiologist   Preanesthetic Checklist Completed: patient identified, IV checked, site marked, risks and benefits discussed, surgical consent, monitors and equipment checked, pre-op evaluation and timeout performed  Epidural Patient position: sitting Prep: ChloraPrep Patient monitoring: heart rate, continuous pulse ox and blood pressure Approach: midline Location: L4-L5 Injection technique: LOR saline  Needle:  Needle type: Tuohy  Needle gauge: 18 G Needle length: 9 cm and 9 Needle insertion depth: 5 cm Catheter type: closed end flexible Catheter size: 20 Guage Catheter at skin depth: 10 cm Test dose: negative and 1.5% lidocaine  with Epi 1:200 K  Assessment Sensory level: T10 Events: blood not aspirated, no cerebrospinal fluid, injection not painful, no injection resistance, no paresthesia and negative IV test  Additional Notes Pt's history reviewed and consent obtained as per OB consent Patient tolerated the insertion well without complications. Negative SATD, negative IVTD All VSS were obtained and monitored through OBIX and nursing protocols followed.Reason for block:procedure for pain

## 2024-02-10 NOTE — Discharge Summary (Signed)
 Obstetrical Discharge Summary  Patient Name: Amber Bowen DOB: 1995/03/22 MRN: [id number]  See Discharge summery by Delon Coe CNM

## 2024-02-10 NOTE — H&P (Signed)
 [name] Labor & Delivery  History and Physical   HPI   Chief Complaint: Labor induction  Amber Bowen is a 29 y.o. G1P0000 at [redacted]w[redacted]d who presents for planned IOL for BMI   Patient reports: Fetal movement: Yes Contractions: No Loss/ leakage of Fluid: No Vaginal bleeding: No  Pregnancy Complications Patient Active Problem List   Diagnosis Date Noted   Encounter for elective induction of labor 02/10/2024    Review of Systems A twelve point review of systems was negative except as stated in HPI.   HISTORY   Medications Medications Prior to Admission  Medication Sig Dispense Refill Last Dose/Taking   Prenatal Vit-Fe Fumarate-FA (PRENATAL MULTIVITAMIN) TABS tablet Take 1 tablet by mouth daily at 12 noon.   02/10/2024 Morning   chlorhexidine (PERIDEX) 0.12 % solution 15 mLs 2 (two) times daily. (Patient not taking: Reported on 02/10/2024)   Not Taking    Allergies has no known allergies.   OB History OB History  Gravida Para Term Preterm AB Living  1 0 0 0 0 0  SAB IAB Ectopic Multiple Live Births  0 0 0 0 0    # Outcome Date GA Lbr Len/2nd Weight Sex Type Anes PTL Lv  1 Current             Past Medical History Past Medical History:  Diagnosis Date   Known health problems: none     Past Surgical History Past Surgical History:  Procedure Laterality Date   INTRAUTERINE DEVICE (IUD) INSERTION     NO PAST SURGERIES      Social History  reports that she has never smoked. She has never used smokeless tobacco. She reports that she does not currently use alcohol. She reports that she does not use drugs.   Family History family history includes Healthy in her brother, brother, brother, father, sister, sister, and sister; Migraines in her mother.   PHYSICAL EXAM   Vitals:   02/10/24 0054 02/10/24 0104  BP: 115/77   Pulse: 78   Resp:  16  Temp:  97.9 F (36.6 C)  TempSrc:  Oral  Weight:  88.5 kg  Height:  5' 5 (1.651 m)     Constitutional: No distress, well appearing, and well nourished. Neurologic: She is alert and is conversational.  Psychiatric: She has a normal mood and affect.  Musculoskeletal: Normal gait, grossly normal range of motion Cardiovascular: Normal rate.   Pulmonary/Chest: Normal work of breathing.  Gastrointestinal/ Abdominal: Soft. Gravid. There is no tenderness.  Skin: Skin is warm and dry. No rash noted.  Genitourinary: Normal external female genitalia.   Clinical EFW: 7lbs Presentation: Vtx confirmed by sutures  SVE:  Dilation: 3 Effacement (%): 70 Station: -2 Presentation: Vertex Exam by:: D Burns, RN  NST Interpretation Baseline: 130 bpm Variability: moderate Accelerations: present Decelerations: None Contractions: q2-4  PRENATAL LABS FROM OB RESULTS CONSOLE  ABO, Rh: --/--/O POS Performed at [facility], [address] [place], [place] [address]  [phone]/24 0229) Antibody: NEG (01/24 0059) Rubella: Immune (07/30 0149) Varicella:   RPR:    HBsAg: Negative (07/30 0149)  HC No results found for: HCVAB HIV: Non-reactive (10/27 0149)  GC:  01/29/24 Neg CT:  01/29/24 Neg 1h GTT: 79 3h GTT:   GBS: Negative/-- (01/12 0149)  Aneuploidy screening (NIPT): WNL, female Anatomy Scan: 19+4 wks normal anatomy, posterior placenta Growth scans: 28 wks EFW 28%tile, AC 21%tile, AFI 10.2cm 33+5wk EFW 48%tile, AC 43%tile, AFI 14.7cm  ENCOUNTER  LABS   CBC    Component Value Date/Time   WBC 8.4 02/10/2024 0059   RBC 3.79 (L) 02/10/2024 0059   HGB 12.9 02/10/2024 0059   HCT 36.1 02/10/2024 0059   PLT 232 02/10/2024 0059   MCV 95.3 02/10/2024 0059   MCH 34.0 02/10/2024 0059   MCHC 35.7 02/10/2024 0059   RDW 12.1 02/10/2024 0059      ASSESSMENT AND PLAN   Amber Bowen is a 29 y.o. G1P0000 at [redacted]w[redacted]d with EDD: 02/17/2024, by Last Menstrual Period admitted for induction of labor due to BMI.   Labor Management: - Cervix is ripe. Will move forward with  pitocin .   Fetal Status: - cephalic presentation by SVE - EFW: 7lbs by Leopolds - CEFM - FHR currently category 1  GBS: negative  Pain management: none  Labs/Immunizations: TDAP: Declined. Flu: Declined RSV: Yes Rubella: Immune Varicella: Immune  Postpartum Plan: - Feeding: Formula - Contraception: plans undecided - Prenatal Care Provider: [facility]  Attending Dr. Lovetta was notified of patient admission.

## 2024-02-10 NOTE — Progress Notes (Signed)
 Amber Bowen is a 29 y.o. G1P0000 at [redacted]w[redacted]d by  admitted for induction of labor due to Elective at term.  Subjective: Pain requiring an CLE   Objective: BP (!) 96/48   Pulse 67   Temp 98 F (36.7 C) (Oral)   Resp 16   Ht 5' 5 (1.651 m)   Wt 88.5 kg   LMP 05/13/2023   SpO2 100%   BMI 32.45 kg/m  No intake/output data recorded. No intake/output data recorded.  FHT:  FHR: 140 bpm, variability: moderate,  accelerations:  Present,  decelerations:  Absent UC:   regular, every 2-3 minutes Cx c/c/+3   Labs: Lab Results  Component Value Date   WBC 8.4 02/10/2024   HGB 12.9 02/10/2024   HCT 36.1 02/10/2024   MCV 95.3 02/10/2024   PLT 232 02/10/2024    Assessment / Plan: Spontaneous labor, progressing normally  Anticipated MOD:  NSVD  Debby JINNY Dinsmore, MD 02/10/2024, 11:05 AM

## 2024-02-11 LAB — CBC
HCT: 31.7 % — ABNORMAL LOW (ref 36.0–46.0)
Hemoglobin: 11 g/dL — ABNORMAL LOW (ref 12.0–15.0)
MCH: 34.2 pg — ABNORMAL HIGH (ref 26.0–34.0)
MCHC: 34.7 g/dL (ref 30.0–36.0)
MCV: 98.4 fL (ref 80.0–100.0)
Platelets: 205 10*3/uL (ref 150–400)
RBC: 3.22 MIL/uL — ABNORMAL LOW (ref 3.87–5.11)
RDW: 12.3 % (ref 11.5–15.5)
WBC: 10.8 10*3/uL — ABNORMAL HIGH (ref 4.0–10.5)
nRBC: 0 % (ref 0.0–0.2)

## 2024-02-11 NOTE — Anesthesia Postprocedure Evaluation (Signed)
"   Anesthesia Post Note  Patient: Amber Bowen  Procedure(s) Performed: AN AD HOC LABOR EPIDURAL  Patient location during evaluation: Mother Baby Anesthesia Type: Epidural Level of consciousness: awake and alert Pain management: pain level controlled Vital Signs Assessment: post-procedure vital signs reviewed and stable Respiratory status: spontaneous breathing, nonlabored ventilation and respiratory function stable Cardiovascular status: stable Postop Assessment: no headache, no backache and epidural receding Anesthetic complications: no   No notable events documented.   Last Vitals:  Vitals:   02/11/24 0335 02/11/24 0737  BP: 94/62 108/67  Pulse: 75 63  Resp: 20   Temp: 36.6 C 36.6 C  SpO2: 100% 98%    Last Pain:  Vitals:   02/11/24 0819  TempSrc:   PainSc: 3                  [name]      "

## 2024-02-11 NOTE — Discharge Summary (Signed)
 Postpartum Discharge Summary  Patient Name: Amber Bowen DOB: [birth year] MRN: [id number]  Date of admission: 02/10/2024 Delivery date:02/10/2024 Delivering provider: LOVETTA DEBBY PARAS Date of discharge: 02/11/2024  Primary OB: [facility] OFE:Ejupzwu'd last menstrual period was 05/13/2023. EDC Estimated Date of Delivery: 02/17/24 Gestational Age at Delivery: [redacted]w[redacted]d   Admitting diagnosis: Encounter for elective induction of labor [Z34.90] Intrauterine pregnancy: [redacted]w[redacted]d     Secondary diagnosis:   Principal Problem:   NSVD (normal spontaneous vaginal delivery) Active Problems:   Encounter for elective induction of labor   Discharge Diagnosis: Term Pregnancy Delivered      Hospital course: Induction of Labor With Vaginal Delivery   29 y.o. yo G1P1001 at [redacted]w[redacted]d was admitted to the hospital 02/10/2024 for induction of labor.  Indication for induction: elective.  Patient had an labor course complicated by none Membrane Rupture Time/Date: 5:10 AM,02/10/2024  Delivery Method:Vaginal, Spontaneous Operative Delivery:N/A Episiotomy:   Lacerations:  Labial Details of delivery can be found in separate delivery note.  Patient had a postpartum course complicated by none. Patient is discharged home 02/11/24.  Newborn Data: Birth date:02/10/2024 Birth time:11:53 AM Gender:Female Living status:Living Apgars:8 ,9  Weight:3140 g                                            Post partum procedures:none Induction:: Pitocin  Complications: None Delivery Type: spontaneous vaginal delivery Anesthesia: epidural anesthesia Placenta: spontaneous To Pathology: No   Prenatal Labs:  ABO, Rh: --/--/O POS Performed at [facility], [address] [place], [place] [address]  [phone]) Antibody: NEG (01/24 0059) Rubella: Immune (07/30 0149) Varicella:   RPR:    HBsAg: Negative (07/30 0149)  HC   Magnesium  Sulfate received: No BMZ received: No Rhophylac:was not  indicated MMR: was not indicated Varivax vaccine given: was not indicated T-DaP:Declined prenatally  Flu: Declined prenatally   Transfusion:No  Physical exam  Vitals:   02/10/24 1928 02/11/24 0008 02/11/24 0335 02/11/24 0737  BP: (!) 92/58 105/73 94/62 108/67  Pulse: 81 68 75 63  Resp: 20 20 20    Temp: 98.3 F (36.8 C) 98.1 F (36.7 C) 97.9 F (36.6 C) 97.9 F (36.6 C)  TempSrc: Oral Oral Oral Oral  SpO2: 98% 98% 100% 98%  Weight:      Height:       General: alert, cooperative, and no distress Lochia: appropriate Uterine Fundus: firm Perineum:minimal edema/intact , Labial lac healing well Incision: N/a DVT Evaluation: No evidence of DVT seen on physical exam.  Labs: Lab Results  Component Value Date   WBC 10.8 (H) 02/11/2024   HGB 11.0 (L) 02/11/2024   HCT 31.7 (L) 02/11/2024   MCV 98.4 02/11/2024   PLT 205 02/11/2024       No data to display         Edinburgh Score:    02/10/2024    6:00 PM  Edinburgh Postnatal Depression Scale Screening Tool  I have been able to laugh and see the funny side of things. 0  I have looked forward with enjoyment to things. 0  I have blamed myself unnecessarily when things went wrong. 1  I have been anxious or worried for no good reason. 1  I have felt scared or panicky for no good reason. 0  Things have been getting on top of me. 1  I have been so unhappy that I  have had difficulty sleeping. 1  I have felt sad or miserable. 1  I have been so unhappy that I have been crying. 0  The thought of harming myself has occurred to me. 0  Edinburgh Postnatal Depression Scale Total 5    Risk assessment for postpartum VTE and prophylactic treatment: Very high risk factors: None High risk factors: None Moderate risk factors: None  Postpartum VTE prophylaxis with LMWH not indicated  After visit meds:  Allergies as of 02/11/2024   No Known Allergies      Medication List     STOP taking these medications    chlorhexidine  0.12 % solution Commonly known as: PERIDEX       TAKE these medications    prenatal multivitamin Tabs tablet Take 1 tablet by mouth daily at 12 noon.       Discharge home in stable condition Infant Feeding: Bottle Infant Disposition:home with mother Discharge instruction: per After Visit Summary and Postpartum booklet. Activity: Advance as tolerated. Pelvic rest for 6 weeks.  Diet: routine diet Anticipated Birth Control: IUD Postpartum Appointment:6 weeks Additional Postpartum F/U: none Future Appointments:No future appointments. Follow up Visit:  Follow-up Information     [facility]. Schedule an appointment as soon as possible for a visit in 6 week(s).   Why: 6 week postpartum follow up Contact information: [address] [place]  [address] [phone]                Plan:  SHARELLE BURDITT was discharged to home in good condition. Follow-up appointment as directed.    Signed: Jenifer E Vincent Ehrler 02/11/2024 11:46 AM

## 2024-02-11 NOTE — Discharge Instructions (Signed)
 Call office if you have any of the following:  -Persistent headache or visual changes (possible high blood pressure) -Fever >101.0 F or chills (possible infection) -Breast concerns (engorgement, mastitis) -Excessive vaginal bleeding (soaking through more than one pad in 1 hr x 2 hr) -Incision drainage/redness/increased pain/warmth at site (possible infection)  -Leg pain or redness (possible blood clot) -Depression/anxiety increased symptoms 2 weeks after delivery  Activity & Hygiene: -Do not lift > 10 lbs for 6 weeks.  -No intercourse or tampons for 6 weeks.  -No swimming pools, hot tubs or tub baths- showers only for 6 weeks **No driving for 1-2 weeks or while taking pain medication after c-section  -It is normal to bleed for up to 6 weeks. You should not soak through more than 1 pad in 1 hour x 2 hours.  Breastfeeding: -Continue prenatal vitamin.  -Increase calories and fluids.  -Your milk will come in, in the next couple of days (right now it is colostrum).  -You may have a slight fever when your milk comes in, but it should go away on its own.   -If it does not, and rises above 101 F please call the doctor.  -You will also feel achy and your breasts will be firm. They will also start to leak.  *For breastfeeding concerns, the lactation consultant can be reached at [phone]  Not Breastfeeding: -Avoid breast stimulation and wear supportive bra -ICE helps decrease inflammation and pain -Express milk for comfort by hand, do not empty breast  Postpartum blues: -feelings of happy one minute and sad another minute are normal for the first few weeks. -if it gets worse please let your doctor know. It is very common!!

## 2024-02-11 NOTE — Progress Notes (Signed)
 Patient discharged home with family.  Discharge instructions, when to follow up, and medications reviewed with patient.  Patient verbalized understanding. Patient will be escorted out by staff.
